# Patient Record
Sex: Male | Born: 1995 | Race: White | Hispanic: No | Marital: Single | Smoking: Never smoker
Health system: Southern US, Community
[De-identification: ages and names within clinical notes are randomized; demographics above are authoritative.]

## PROBLEM LIST (undated history)

## (undated) DIAGNOSIS — J939 Pneumothorax, unspecified: Secondary | ICD-10-CM

## (undated) DIAGNOSIS — G40909 Epilepsy, unspecified, not intractable, without status epilepticus: Secondary | ICD-10-CM

## (undated) DIAGNOSIS — J302 Other seasonal allergic rhinitis: Secondary | ICD-10-CM

## (undated) DIAGNOSIS — J45909 Unspecified asthma, uncomplicated: Secondary | ICD-10-CM

## (undated) HISTORY — DX: Pneumothorax, unspecified: J93.9

## (undated) HISTORY — DX: Other seasonal allergic rhinitis: J30.2

---
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
## 2013-03-03 ENCOUNTER — Emergency Department (HOSPITAL_COMMUNITY): Payer: Medicaid Other

## 2013-03-03 ENCOUNTER — Emergency Department (HOSPITAL_COMMUNITY)
Admission: EM | Admit: 2013-03-03 | Discharge: 2013-03-03 | Disposition: A | Discharge status: Home / Self Care | Payer: Medicaid Other | Attending: Emergency Medicine | Admitting: Emergency Medicine

## 2013-03-03 ENCOUNTER — Encounter (HOSPITAL_COMMUNITY): Payer: Self-pay | Admitting: Emergency Medicine

## 2013-03-03 DIAGNOSIS — J3489 Other specified disorders of nose and nasal sinuses: Secondary | ICD-10-CM | POA: Insufficient documentation

## 2013-03-03 DIAGNOSIS — R509 Fever, unspecified: Secondary | ICD-10-CM | POA: Insufficient documentation

## 2013-03-03 DIAGNOSIS — IMO0002 Reserved for concepts with insufficient information to code with codable children: Secondary | ICD-10-CM | POA: Insufficient documentation

## 2013-03-03 DIAGNOSIS — J9383 Other pneumothorax: Secondary | ICD-10-CM | POA: Insufficient documentation

## 2013-03-03 DIAGNOSIS — J45909 Unspecified asthma, uncomplicated: Secondary | ICD-10-CM | POA: Insufficient documentation

## 2013-03-03 DIAGNOSIS — Z79899 Other long term (current) drug therapy: Secondary | ICD-10-CM | POA: Insufficient documentation

## 2013-03-03 DIAGNOSIS — G40909 Epilepsy, unspecified, not intractable, without status epilepticus: Secondary | ICD-10-CM | POA: Insufficient documentation

## 2013-03-03 HISTORY — DX: Epilepsy, unspecified, not intractable, without status epilepticus: G40.909

## 2013-03-03 HISTORY — DX: Unspecified asthma, uncomplicated: J45.909

## 2013-03-03 LAB — BASIC METABOLIC PANEL
CO2: 27 mEq/L (ref 19–32)
Calcium: 9.6 mg/dL (ref 8.4–10.5)
Chloride: 101 mEq/L (ref 96–112)
Sodium: 138 mEq/L (ref 135–145)

## 2013-03-03 LAB — CBC WITH DIFFERENTIAL/PLATELET
Basophils Absolute: 0 10*3/uL (ref 0.0–0.1)
Eosinophils Relative: 10 % — ABNORMAL HIGH (ref 0–5)
Lymphocytes Relative: 12 % (ref 12–46)
Neutro Abs: 4.4 10*3/uL (ref 1.7–7.7)
Platelets: 212 10*3/uL (ref 150–400)
RDW: 11.4 % — ABNORMAL LOW (ref 11.5–15.5)
WBC: 6.8 10*3/uL (ref 4.0–10.5)

## 2013-03-03 MED ORDER — SODIUM CHLORIDE 0.9 % IV SOLN
INTRAVENOUS | Status: DC
  Administered 2013-03-03: 11:00:00 via INTRAVENOUS

## 2013-03-03 MED ORDER — BENZONATATE 100 MG PO CAPS: 100.0000 mg | ORAL_CAPSULE | Freq: Three times a day (TID) | ORAL | Status: DC

## 2013-03-03 MED ORDER — MORPHINE SULFATE 4 MG/ML IJ SOLN
4.0000 mg | INTRAMUSCULAR | Status: DC | PRN
  Administered 2013-03-03: 4 mg via INTRAVENOUS
  Filled 2013-03-03: qty 1

## 2013-03-03 NOTE — ED Notes (Addendum)
Pt stated that he had cough with fever x2 days .When asked pt stated that he took Tylenol 650mg  po

## 2013-03-03 NOTE — ED Provider Notes (Signed)
History    CSN: [id number] Arrival date & time 03/03/13  2130 First MD Initiated Contact with Patient 03/03/13 [phone]      Chief Complaint  Patient presents with  . Cough  . Fever    Patient is a 17 y.o. male presenting with cough and fever. The history is provided by the patient.  Cough Cough characteristics:  Dry Severity:  Moderate Onset quality:  Gradual Duration:  2 days Timing:  Constant Chronicity:  New Smoker: no   Context: smoke exposure (cigarette)   Relieved by:  Nothing Worsened by:  Nothing tried Associated symptoms: fever, rhinorrhea and sinus congestion   Associated symptoms: no chills and no rash   Fever Associated symptoms: cough and rhinorrhea   Associated symptoms: no chills and no rash     Past Medical History  Diagnosis Date  . Asthma   . Epilepsy     History reviewed. No pertinent past surgical history.  History reviewed. No pertinent family history.  History  Substance Use Topics  . Smoking status: Never Smoker   . Smokeless tobacco: Not on file  . Alcohol Use: No      Review of Systems  Constitutional: Positive for fever. Negative for chills.  HENT: Positive for rhinorrhea.   Respiratory: Positive for cough.   Skin: Negative for rash.  All other systems reviewed and are negative.    Allergies  Review of patient's allergies indicates no known allergies.  Home Medications   Current Outpatient Rx  Name  Route  Sig  Dispense  Refill  . acetaminophen (TYLENOL) 500 MG tablet   Oral   Take 1,000 mg by mouth every 6 (six) hours as needed for pain.         Marland Kitchen albuterol (PROVENTIL HFA;VENTOLIN HFA) 108 (90 BASE) MCG/ACT inhaler   Inhalation   Inhale 2 puffs into the lungs every 6 (six) hours as needed for wheezing or shortness of breath.         . fluticasone (FLOVENT HFA) 110 MCG/ACT inhaler   Inhalation   Inhale 1 puff into the lungs 2 (two) times daily.         Marland Kitchen levETIRAcetam (KEPPRA) 500 MG tablet   Oral   Take 500  mg by mouth every 12 (twelve) hours.         Marland Kitchen loratadine (CLARITIN) 10 MG tablet   Oral   Take 10 mg by mouth daily.           BP 114/75  Pulse 105  Temp(Src) 99.2 F (37.3 C) (Oral)  Resp 20  Ht 5\' 9"  (1.753 m)  Wt 135 lb (61.236 kg)  BMI 19.93 kg/m2  SpO2 96%  Physical Exam  Nursing note and vitals reviewed. Constitutional: He appears well-developed and well-nourished. No distress.  HENT:  Head: Normocephalic and atraumatic.  Right Ear: External ear normal.  Left Ear: External ear normal.  Eyes: Conjunctivae are normal. Right eye exhibits no discharge. Left eye exhibits no discharge. No scleral icterus.  Neck: Neck supple. No tracheal deviation present.  Cardiovascular: Normal rate, regular rhythm and intact distal pulses.   Pulmonary/Chest: Effort normal. No stridor. No respiratory distress. He has no wheezes. He has no rales.  Questionable decreased breath sounds right side  Abdominal: Soft. Bowel sounds are normal. He exhibits no distension. There is no tenderness. There is no rebound and no guarding.  Musculoskeletal: He exhibits no edema and no tenderness.  Neurological: He is alert. He has normal strength. No sensory deficit.  Cranial nerve deficit:  no gross defecits noted. He exhibits normal muscle tone. He displays no seizure activity. Coordination normal.  Skin: Skin is warm and dry. No rash noted.  Psychiatric: He has a normal mood and affect.    ED Course  Procedures (including critical care time)  Labs Reviewed  CBC WITH DIFFERENTIAL  BASIC METABOLIC PANEL   Dg Chest 2 View  03/03/2013   *RADIOLOGY REPORT*  Clinical Data: Cough, congestion and fever.  CHEST - 2 VIEW  Comparison: 10/14/2011  Findings: There is a spontaneous right pneumothorax of approximately 20% volume.  No underlying pulmonary nodule or infiltrate is seen.  There is no evidence of shift of the mediastinal structures.  Heart size is normal.  No pleural effusion is seen.  The bony thorax  is unremarkable.  IMPRESSION: Spontaneous right pneumothorax of approximately 20% volume. Findings were communicated directly to Dr. Lynelle Doctor at [phone] hours.   Original Report Authenticated By: Irish Lack, M.D.     1. Spontaneous pneumothorax       MDM  Pt has a spontaneous pneumothorax.  He is not in any distress.  I Consulted with Dr Tyrone Sage.  He is in the OR right now.  I contacted the office and they will have another cardiothoracic surgeon come to see him in the ED.  Dr. Laneta Simmers called me in regards to Mr. Allende.  He recommended a serial chest x-ray. If the pneumothorax remained stable he recommended outpatient followup but no need for any acute intervention.  The patient was monitored in the emergency department.  The patient was supposed to Get his second x-ray at 1330 but the xray tech inadvertently took him earlier.  Pt then did get an additional xray at 1330.      Celene Kras, MD 03/03/13 [phone]

## 2013-03-03 NOTE — Progress Notes (Signed)
Consulted with Dr. Luciana Axe regarding need for precautions and none advised at this time.

## 2013-03-06 ENCOUNTER — Ambulatory Visit (INDEPENDENT_AMBULATORY_CARE_PROVIDER_SITE_OTHER): Payer: Medicaid Other | Admitting: Physician Assistant

## 2013-03-06 ENCOUNTER — Other Ambulatory Visit: Payer: Self-pay | Admitting: *Deleted

## 2013-03-06 ENCOUNTER — Ambulatory Visit
Admission: RE | Admit: 2013-03-06 | Discharge: 2013-03-06 | Disposition: A | Discharge status: Home / Self Care | Payer: Medicaid Other | Source: Ambulatory Visit | Attending: Surgery | Admitting: Surgery

## 2013-03-06 VITALS — BP 103/73 | HR 88 | Resp 20 | Ht 69.0 in | Wt 135.0 lb

## 2013-03-06 DIAGNOSIS — J948 Other specified pleural conditions: Secondary | ICD-10-CM

## 2013-03-06 DIAGNOSIS — J939 Pneumothorax, unspecified: Secondary | ICD-10-CM | POA: Insufficient documentation

## 2013-03-06 DIAGNOSIS — J9383 Other pneumothorax: Secondary | ICD-10-CM

## 2013-03-06 NOTE — Progress Notes (Signed)
Patient ID: Antonio Miles, male   DOB: [birth year], 17 y.o.   MRN: [id number]  S:  Antonio Miles is an 17 yo male who presented to the Emergency Department on 03/03/2013 with complaints of cough and shortness of breath.  CXR obtained showed evidence of a right sided Spontaneous Pneumothorax.  Serial CXR was performed and the pneumothorax did not change in size.  TCTS was consulted and the films were reviewed by Dr. Laneta Simmers who did not feel patient would require intervention with chest tube placement at that time.  The patient was later discharged home and instructed to follow up with our office.  Currently the patient states he is doing better.  He is still experiencing cold like symptoms of runny nose and cough.  He denies chest/shoulder pain and shortness of breath.  Patient states he does not smoke and has never smoked, however ED chart states tobacco use.  O: BP 103/73  Pulse 88  Resp 20  Ht 5\' 9"  (1.753 m)  Wt 135 lb (61.236 kg)  BMI 19.93 kg/m2  SpO2 97%  Gen: no apparent distress Lungs: Coarse bilaterally, clears with cough, decreased air movement RUL Heart: RRR Abd: soft non-tender non-distended Neuro: grossly intact  CXR: improvement of right sided pneumothorax, no evidence of tension  A/P:  1. Right sided Pneumothorax- improved from previous film 2. Patient counseled on importance of tobacco cessation 3. RTC in 2 weeks with a CXR prior to his appointment.  Should he develop worsening shortness of breath, shoulder or chest pain he was instructed to report to the Emergency Department.

## 2013-03-15 ENCOUNTER — Ambulatory Visit: Payer: Medicaid Other | Admitting: Surgery

## 2013-03-23 ENCOUNTER — Other Ambulatory Visit: Payer: Self-pay | Admitting: *Deleted

## 2013-03-23 DIAGNOSIS — J9383 Other pneumothorax: Secondary | ICD-10-CM

## 2013-03-29 ENCOUNTER — Ambulatory Visit: Payer: Medicaid Other | Admitting: Surgery

## 2013-03-29 ENCOUNTER — Ambulatory Visit
Admission: RE | Admit: 2013-03-29 | Discharge: 2013-03-29 | Disposition: A | Discharge status: Home / Self Care | Payer: Medicaid Other | Source: Ambulatory Visit | Attending: Surgery | Admitting: Surgery

## 2013-03-29 ENCOUNTER — Ambulatory Visit (INDEPENDENT_AMBULATORY_CARE_PROVIDER_SITE_OTHER): Payer: Medicaid Other | Admitting: Surgery

## 2013-03-29 ENCOUNTER — Encounter: Payer: Self-pay | Admitting: Surgery

## 2013-03-29 VITALS — BP 115/77 | HR 104 | Resp 17 | Ht 72.0 in | Wt 130.0 lb

## 2013-03-29 DIAGNOSIS — J9383 Other pneumothorax: Secondary | ICD-10-CM

## 2013-03-29 DIAGNOSIS — J939 Pneumothorax, unspecified: Secondary | ICD-10-CM

## 2013-03-29 NOTE — Progress Notes (Signed)
      [address]       [place] [address]             [phone]        HPI:    Mr. Ullom is an 17 yo male who presented to the Emergency Department on 03/03/2013 with complaints of cough and shortness of breath.  CXR obtained showed evidence of a right sided Spontaneous Pneumothorax.  Serial CXR was performed and the pneumothorax did not change in size.  TCTS was consulted and the films were reviewed by me and I did not feel patient would require intervention with chest tube placement at that time.  The patient was later discharged home and instructed to follow up with our office. He saw one of the PA's 2 weeks ago and the pneumothorax was resolving on cxr.  He denies chest/shoulder pain and shortness of breath.  Patient states he does not smoke and has never smoked.  Current Outpatient Prescriptions  Medication Sig Dispense Refill  . acetaminophen (TYLENOL) 500 MG tablet Take 1,000 mg by mouth every 6 (six) hours as needed for pain.      Marland Kitchen albuterol (PROVENTIL HFA;VENTOLIN HFA) 108 (90 BASE) MCG/ACT inhaler Inhale 2 puffs into the lungs every 6 (six) hours as needed for wheezing or shortness of breath.      . benzonatate (TESSALON) 100 MG capsule Take 1 capsule (100 mg total) by mouth every 8 (eight) hours.  21 capsule  0  . fluticasone (FLOVENT HFA) 110 MCG/ACT inhaler Inhale 1 puff into the lungs 2 (two) times daily.      Marland Kitchen levETIRAcetam (KEPPRA) 500 MG tablet Take 500 mg by mouth every 12 (twelve) hours.      Marland Kitchen loratadine (CLARITIN) 10 MG tablet Take 10 mg by mouth daily.       No current facility-administered medications for this visit.     Physical Exam: BP 115/77  Pulse 104  Resp 17  Ht 6' (1.829 m)  Wt 130 lb (58.968 kg)  BMI 17.63 kg/m2  SpO2 98% He looks well. Lung exam is clear.   Diagnostic Tests:  *RADIOLOGY REPORT*   Clinical Data: Follow up pneumothorax.  No current complaints.   CHEST - 2 VIEW   Comparison: 03/06/2013   Findings: The  right side pneumothorax has resolved.  There is no residual pneumothorax.   The heart, mediastinum and hila are within normal limits.  The lungs are clear.  No pleural effusion.  Normal bony thorax and surrounding soft tissues.   IMPRESSION: Resolved right pneumothorax.     Original Report Authenticated By: Amie Portland, M.D.     Impression:  The spontaneous right pneumothorax has completely resolved. I explained to the patient that the chance of this occurring again is about 20% of the next few years.  Plan:  He will contact our office or go to the ER if he develops any recurrent symptoms.

## 2014-10-24 IMAGING — CR DG CHEST 2V
2 series · 2 of 2 positions shown · non-contrast
Comparison: Chest x-ray of 03/03/2013

CLINICAL DATA: History of right hydropneumothorax, cough

CHEST - 2 VIEW

[w chest pa]
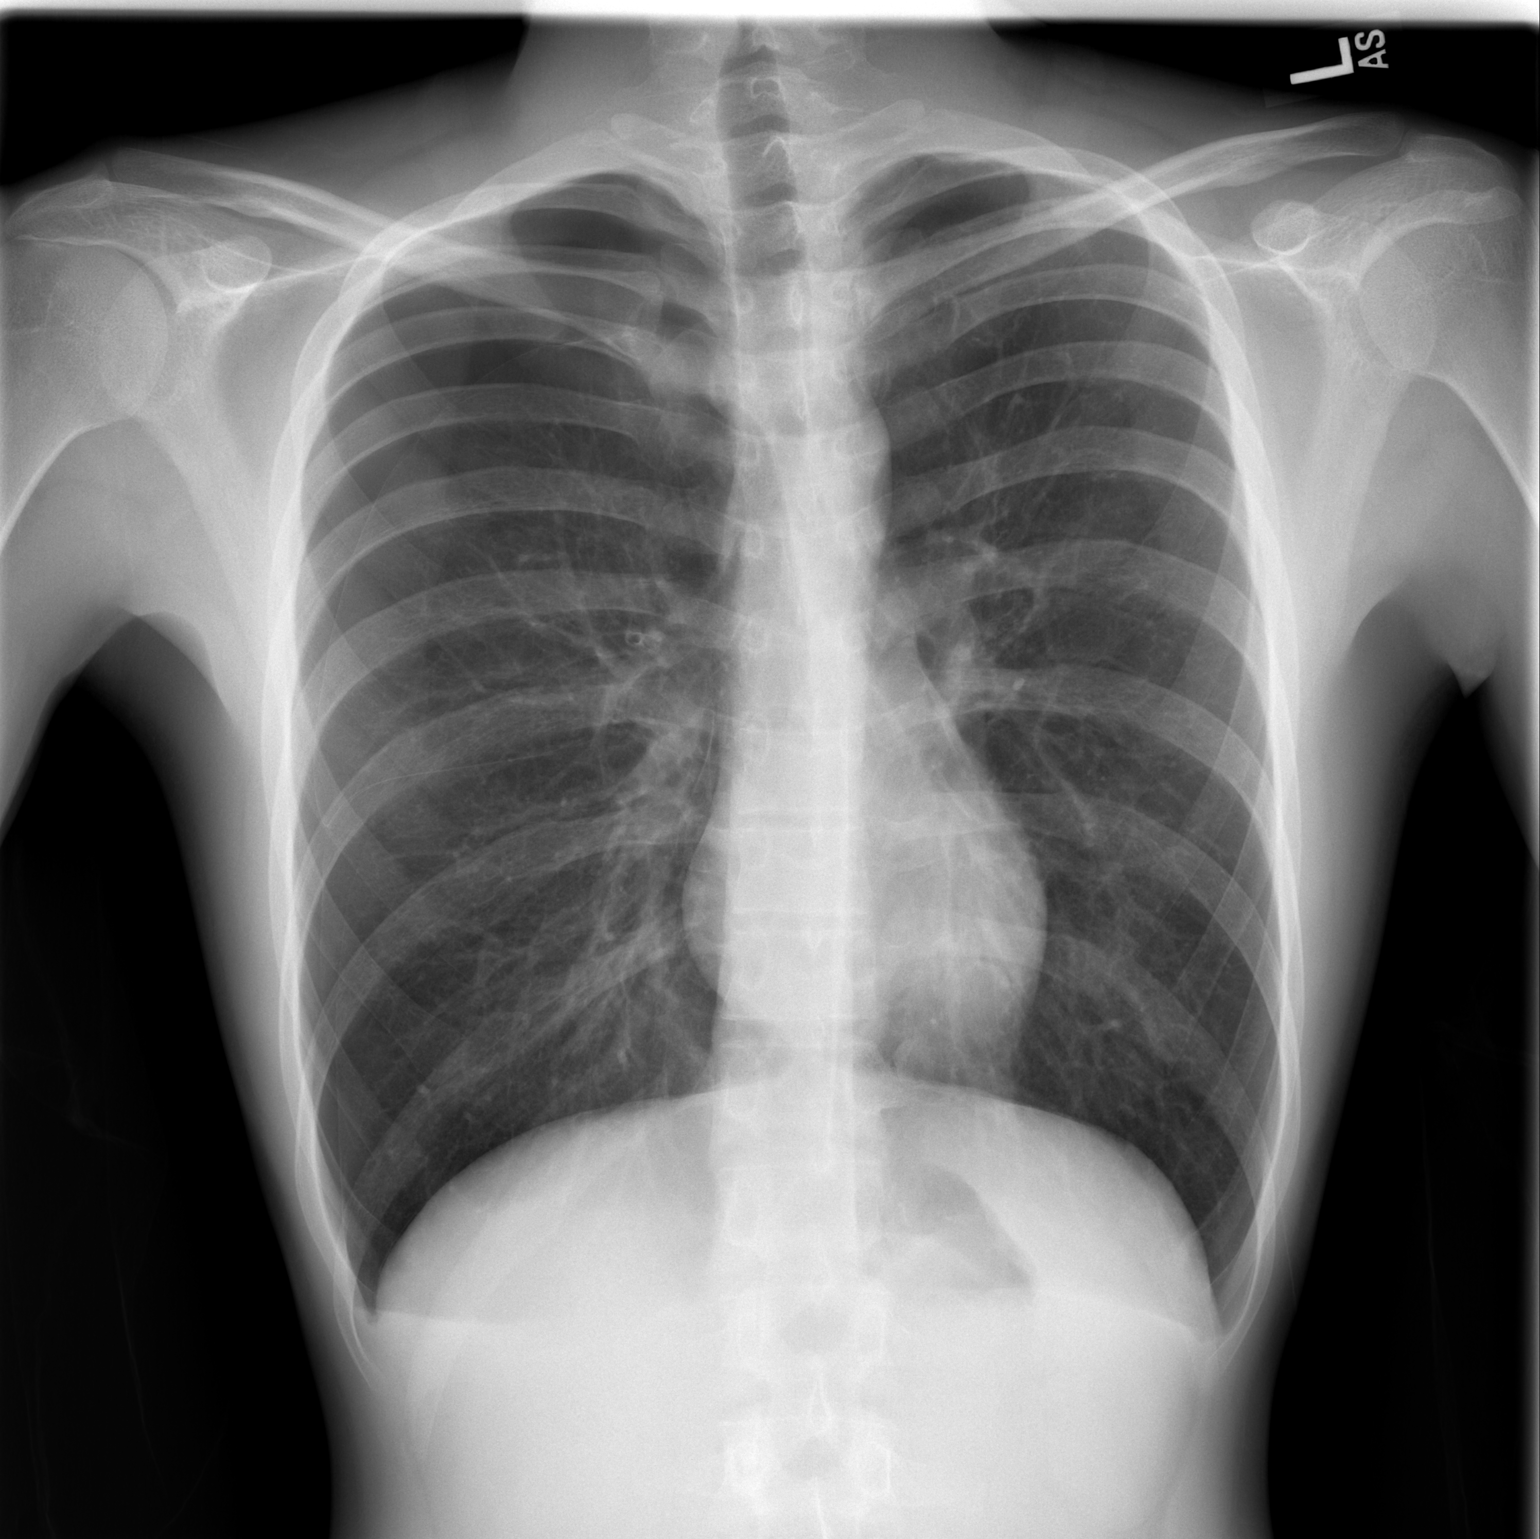

[w chest lat]
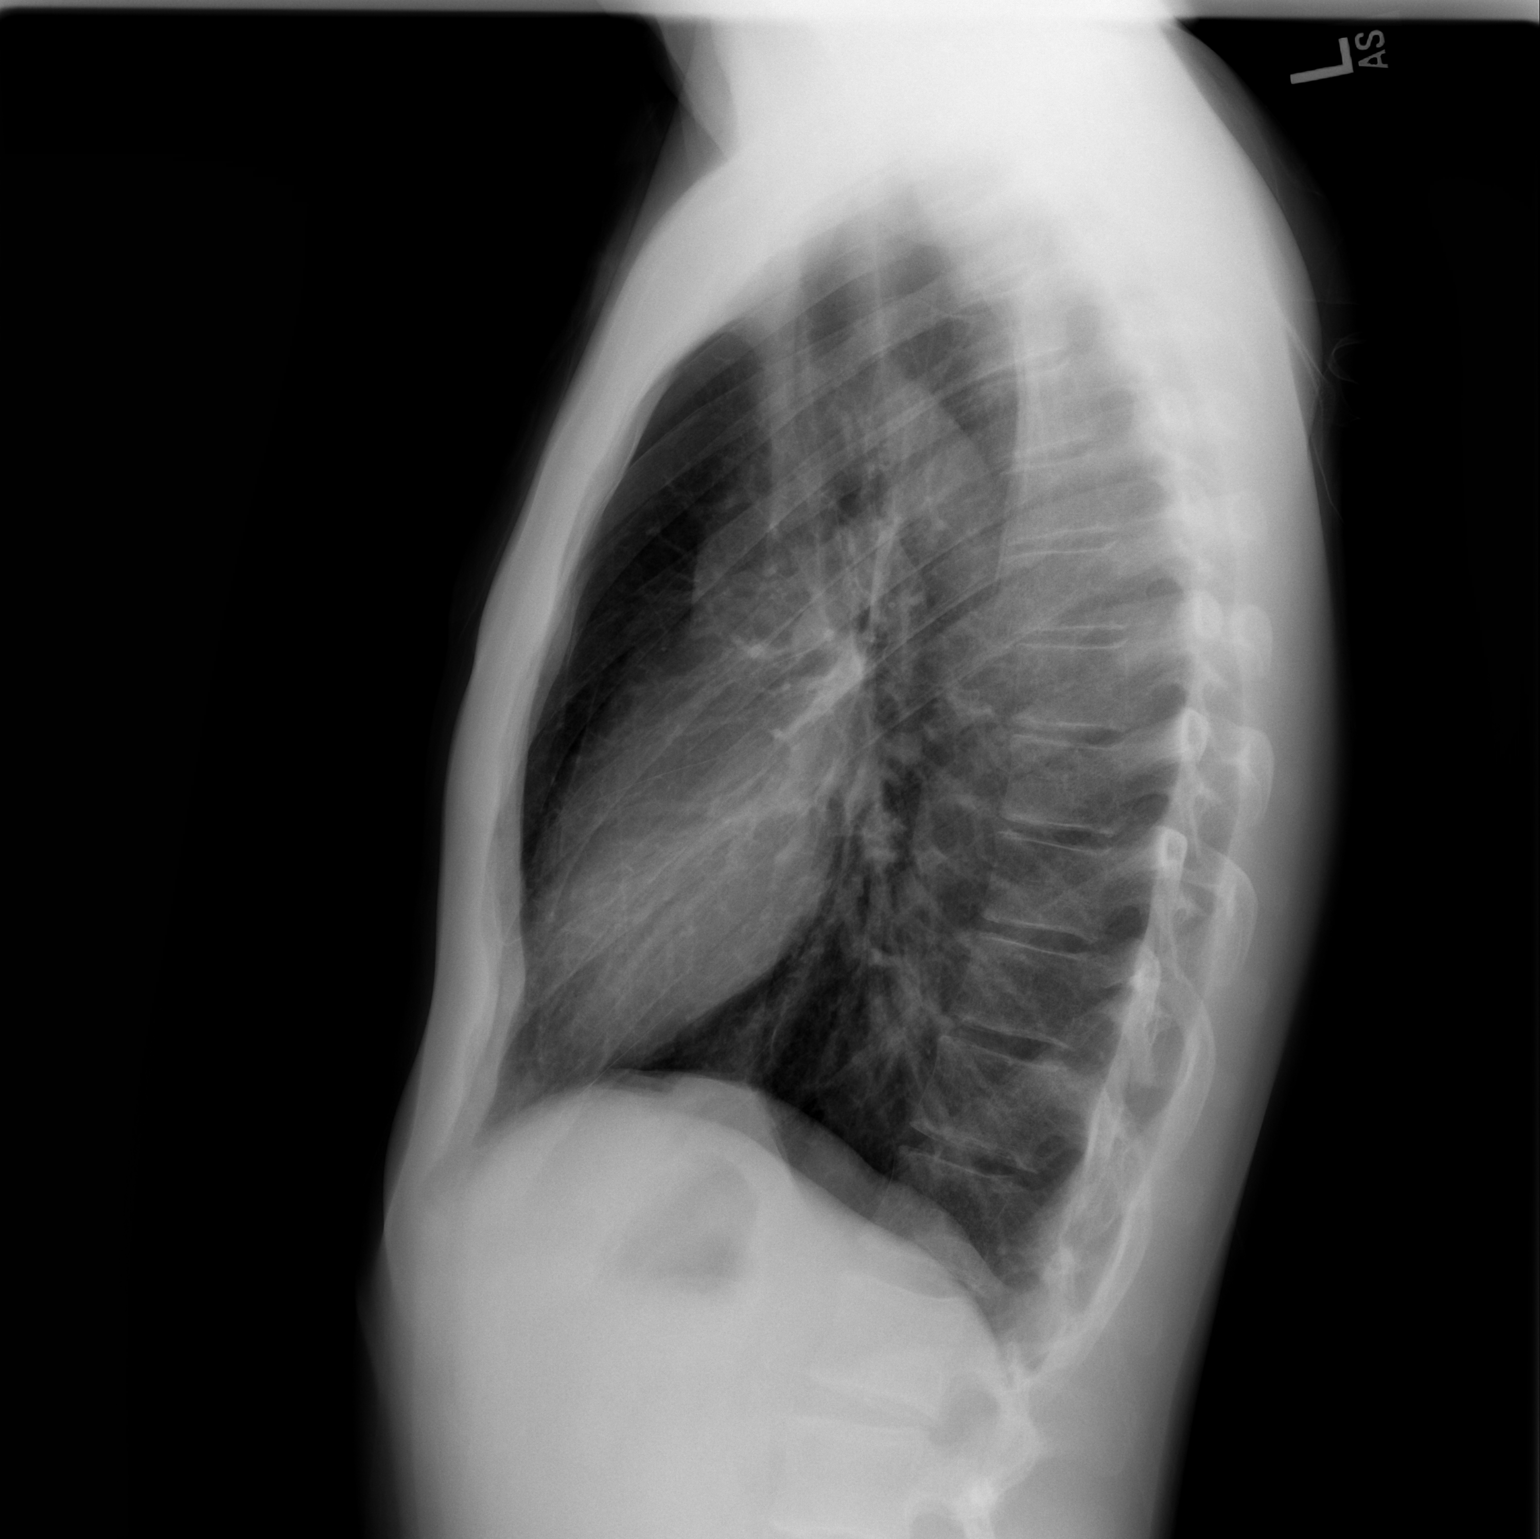

[2 of 2 positions shown; findings below may reference images not displayed]

FINDINGS: The right hydropneumothorax may have diminished slightly
in overall volume.  The left lung is clear.  Mediastinal contours
are stable, and heart size is stable.  No bony abnormality is seen.
IMPRESSION: Little change to perhaps slight decrease in volume of the right
hydropneumothorax.

## 2017-10-07 ENCOUNTER — Encounter: Payer: Self-pay | Admitting: Neurology

## 2017-12-17 ENCOUNTER — Other Ambulatory Visit: Payer: PRIVATE HEALTH INSURANCE

## 2017-12-17 ENCOUNTER — Other Ambulatory Visit: Payer: Self-pay

## 2017-12-17 ENCOUNTER — Ambulatory Visit: Payer: PRIVATE HEALTH INSURANCE | Admitting: Neurology

## 2017-12-17 ENCOUNTER — Encounter: Payer: Self-pay | Admitting: Neurology

## 2017-12-17 VITALS — BP 126/84 | HR 100 | Wt 140.0 lb

## 2017-12-17 DIAGNOSIS — G40209 Localization-related (focal) (partial) symptomatic epilepsy and epileptic syndromes with complex partial seizures, not intractable, without status epilepticus: Secondary | ICD-10-CM | POA: Insufficient documentation

## 2017-12-17 DIAGNOSIS — Q851 Tuberous sclerosis: Secondary | ICD-10-CM

## 2017-12-17 MED ORDER — PHENYTOIN SODIUM EXTENDED 100 MG PO CAPS: ORAL_CAPSULE | ORAL | 3 refills | Status: AC

## 2017-12-17 NOTE — Patient Instructions (Addendum)
1. Schedule 1-hour sleep-deprived EEG 2. Bloodwork for Dilantin level  Your provider has requested that you have labwork completed today. Please go to [facility] ([id number]) on the second floor of this building before leaving the office today. You do not need to check in. If you are not called within 15 minutes please check with the front desk.   3. Continue Dilantin 300mg  at bedtime for now. We may increase dose depending on your blood level 4. Keep a calendar of your seizures 5. Records from [facility] will be requested for review 6. Follow-up in 4-5 months, call for any changes  Seizure Precautions: 1. If medication has been prescribed for you to prevent seizures, take it exactly as directed.  Do not stop taking the medicine without talking to your doctor first, even if you have not had a seizure in a long time.   2. Avoid activities in which a seizure would cause danger to yourself or to others.  Don't operate dangerous machinery, swim alone, or climb in high or dangerous places, such as on ladders, roofs, or girders.  Do not drive unless your doctor says you may.  3. If you have any warning that you may have a seizure, lay down in a safe place where you can't hurt yourself.    4.  No driving for 6 months from last seizure, as per [facility].   Please refer to the following link on the [facility] website for more information: http://www.epilepsyfoundation.org/[email or web address]/driving/drivingu.cfm   5.  Maintain good sleep hygiene. Avoid alcohol.  6.  Contact your doctor if you have any problems that may be related to the medicine you are taking.  7.  Call 911 and bring the patient back to the ED if:        A.  The seizure lasts longer than 5 minutes.       B.  The patient doesn't awaken shortly after the seizure  C.  The patient has new problems such as difficulty seeing, speaking or moving  D.  The patient was injured during the seizure  E.   The patient has a temperature over 102 F (39C)  F.  The patient vomited and now is having trouble breathing

## 2017-12-17 NOTE — Progress Notes (Signed)
NEUROLOGY CONSULTATION NOTE  Antonio Miles MRN: [id number] DOB: [birth year]  Referring provider: Dr. Suzanna Obey Primary care provider: Dr. Suzanna Obey  Reason for consult:  Establish care for epilepsy  Dear Dr Doreene Nest:  Thank you for your kind referral of Antonio Miles for consultation of the above symptoms. Although his history is well known to you, please allow me to reiterate it for the purpose of our medical record. The patient was accompanied to the clinic by his mother who also provides collateral information. Records and images were personally reviewed where available.  HISTORY OF PRESENT ILLNESS: This is a 22 year old right-handed man with a history of tuberous sclerosis and seizures presenting to establish care. His mother reports that he started having seizures at age 1, at which time the tuberous sclerosis was also diagnosed. He reports having partial seizures and grand mal seizures. He has no warning prior to the GTCs, no focal weakness after. The last GTC was in April 2017, his mother heard him fall and found him convulsing. She reports he used to have nocturnal convulsions when younger, none since moving here. The partial seizures occur every few months, he would have an inability to speak and think of words, having a hard time remembering what he was thinking. Occasionally he feels nauseated. His mother witnessed one a few months ago where he would have a blank look ("like he couldn't see me") and stop talking. He reports the last partial seizure was a month ago, but he sometimes feels like it might come on but does not progress around once a week. He denies any olfactory/gustatory hallucinations, focal numbness/tingling/weakness, myoclonic jerks. He and his mother recall he took Depakote in the past which caused weight gain, then Keppra. He was briefly on Vimpat but had more "partial seizure problems." He has been on monotherapy with Dilantin 300mg  qhs for the past few years with no  side effects. He was being followed at [facility] in [facility] for several years until he moved to [facility] in 2017 to help his mother. His mother reports that he occasionally has trouble putting his words together, trying to remember what a word is.   He denies any frequent headaches, dizziness, diplopia, dysarthria/dysphagia, neck/back pain, bowel/bladder dysfunction. Memory is good. He completed 2 years of college. He is unemployed. He does not drive. He lives with his mother.   Epilepsy Risk Factors:  Tuberous sclerosis. His paternal half-sister has seizures. Otherwise he had a normal birth and early development.  There is no history of febrile convulsions, CNS infections such as meningitis/encephalitis, significant traumatic brain injury, neurosurgical procedures.  Diagnostic Data: I personally reviewed MRI brain done 10/2011 which showed multiple foci of increased signal consistent with cortical tubers, most noticeable in the left parietal cortical and subcortical region.  No prior EEG available for review  PAST MEDICAL HISTORY: Past Medical History:  Diagnosis Date  . Asthma   . Epilepsy ([place])   . Pneumothorax   . Seasonal allergies     PAST SURGICAL HISTORY: History reviewed. No pertinent surgical history.  MEDICATIONS: Current Outpatient Medications on File Prior to Visit  Medication Sig Dispense Refill  . albuterol (PROVENTIL HFA;VENTOLIN HFA) 108 (90 BASE) MCG/ACT inhaler Inhale 2 puffs into the lungs every 6 (six) hours as needed for wheezing or shortness of breath.    . phenytoin (DILANTIN) 100 MG ER capsule Take by mouth.     No current facility-administered medications on file prior to visit.     ALLERGIES: No  Known Allergies  FAMILY HISTORY: Family History  Problem Relation Age of Onset  . Seizures Sister     SOCIAL HISTORY: Social History   Socioeconomic History  . Marital status: Single    Spouse name: Not on file  . Number of children: Not on file  . Years of  education: Not on file  . Highest education level: Not on file  Social Needs  . Financial resource strain: Not on file  . Food insecurity - worry: Not on file  . Food insecurity - inability: Not on file  . Transportation needs - medical: Not on file  . Transportation needs - non-medical: Not on file  Occupational History  . Not on file  Tobacco Use  . Smoking status: Never Smoker  . Smokeless tobacco: Never Used  Substance and Sexual Activity  . Alcohol use: No  . Drug use: No  . Sexual activity: Not Currently  Other Topics Concern  . Not on file  Social History Narrative  . Not on file    REVIEW OF SYSTEMS: Constitutional: No fevers, chills, or sweats, no generalized fatigue, change in appetite Eyes: No visual changes, double vision, eye pain Ear, nose and throat: No hearing loss, ear pain, nasal congestion, sore throat Cardiovascular: No chest pain, palpitations Respiratory:  No shortness of breath at rest or with exertion, wheezes GastrointestinaI: No nausea, vomiting, diarrhea, abdominal pain, fecal incontinence Genitourinary:  No dysuria, urinary retention or frequency Musculoskeletal:  No neck pain, back pain Integumentary: No rash, pruritus, skin lesions Neurological: as above Psychiatric: No depression, insomnia, anxiety Endocrine: No palpitations, fatigue, diaphoresis, mood swings, change in appetite, change in weight, increased thirst Hematologic/Lymphatic:  No anemia, purpura, petechiae. Allergic/Immunologic: no itchy/runny eyes, nasal congestion, recent allergic reactions, rashes  PHYSICAL EXAM: Vitals:   12/17/17 1410  BP: 126/84  Pulse: 100  SpO2: 96%   General: No acute distress Head:  Normocephalic/atraumatic, he has multiple facial angiofibromas Eyes: Fundoscopic exam shows bilateral sharp discs, no vessel changes, exudates, or hemorrhages Neck: supple, no paraspinal tenderness, full range of motion Back: No paraspinal tenderness Heart: regular rate  and rhythm Lungs: Clear to auscultation bilaterally. Vascular: No carotid bruits. Skin/Extremities: No rash, no edema. No ash leaf spot or shagreen patch noted Neurological Exam: Mental status: alert and oriented to person, place, and time, no dysarthria or aphasia, Fund of knowledge is appropriate.  Recent and remote memory are intact. 3/3 delayed recall. Attention and concentration are normal.    Able to name objects and repeat phrases. Cranial nerves: CN I: not tested CN II: pupils equal, round and reactive to light, visual fields intact, fundi unremarkable. CN III, IV, VI:  full range of motion, no nystagmus, no ptosis CN V: facial sensation intact CN VII: upper and lower face symmetric CN VIII: hearing intact to finger rub CN IX, X: gag intact, uvula midline CN XI: sternocleidomastoid and trapezius muscles intact CN XII: tongue midline Bulk & Tone: normal, no fasciculations. Motor: 5/5 throughout with no pronator drift. Sensation: intact to light touch, cold, pin, vibration and joint position sense.  No extinction to double simultaneous stimulation.  Romberg test negative Deep Tendon Reflexes: +2 throughout, no ankle clonus Plantar responses: downgoing bilaterally Cerebellar: no incoordination on finger to nose, heel to shin. No dysdiadochokinesia Gait: narrow-based and steady, able to tandem walk adequately. 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 Prior MRI brain showed multiple foci of increased signal consistent with cortical tubers, most noticeable in the left parietal cortical and subcortical region. He has not had any convulsions since April 2017, but continues to report partial seizures affecting his speech. His mother has reported this is associated with a blank look. A 1-hour EEG will be ordered  to further classify his seizures. Records from [facility] will be requested for review. He is on Dilantin 300mg  qhs with no side effects, check Dilantin level. He is interested in increasing dose, depending on level. He may benefit from a newer AED with less long-term side effects. He does not drive. He was advised to keep a seizure calendar. He will follow-up in 4-5 months and knows to call for any changes.   Thank you for allowing me to participate in the care of this patient. Please do not hesitate to call for any questions or concerns.   Ellouise Newer, M.D.  CC: Dr. Doreene Nest

## 2017-12-18 LAB — PHENYTOIN LEVEL, TOTAL: Phenytoin, Total: 6.3 mg/L — ABNORMAL LOW (ref 10.0–20.0)

## 2017-12-20 ENCOUNTER — Telehealth: Payer: Self-pay

## 2017-12-20 ENCOUNTER — Other Ambulatory Visit: Payer: Self-pay

## 2017-12-20 MED ORDER — PHENYTOIN 50 MG PO CHEW: 50.0000 mg | CHEWABLE_TABLET | Freq: Every day | ORAL | 11 refills | Status: DC

## 2017-12-20 NOTE — Telephone Encounter (Signed)
-----   Message from Cameron Sprang, MD sent at 12/20/2017  9:38 AM EDT ----- Pls let him know that the Dilantin level is low. Pls make sure he has not missed any doses before we did the blood test. If no missed doses, we will increase Dilantin to 350mg  once a day. He is taking 300mg  qhs, pls send in an additional Rx for Dilantin 50mg  take 1 tab qhs with 300mg , for total for 350mg  qhs. Thanks

## 2017-12-20 NOTE — Telephone Encounter (Signed)
LMOM relaying message below.  

## 2017-12-22 ENCOUNTER — Other Ambulatory Visit: Payer: PRIVATE HEALTH INSURANCE

## 2017-12-29 ENCOUNTER — Ambulatory Visit (INDEPENDENT_AMBULATORY_CARE_PROVIDER_SITE_OTHER): Payer: PRIVATE HEALTH INSURANCE | Admitting: Neurology

## 2017-12-29 DIAGNOSIS — G40209 Localization-related (focal) (partial) symptomatic epilepsy and epileptic syndromes with complex partial seizures, not intractable, without status epilepticus: Secondary | ICD-10-CM

## 2017-12-29 DIAGNOSIS — Q851 Tuberous sclerosis: Secondary | ICD-10-CM

## 2018-01-12 NOTE — Procedures (Signed)
ELECTROENCEPHALOGRAM REPORT  Date of Study: 12/29/2017  Patient's Name: Antonio Miles MRN: [id number] Date of Birth: [birth year]  Referring Provider: Dr. Ellouise Newer  Clinical History: This is a 23 year old man with tuberous sclerosis and seizures with blank look and speech difficulties. EEG for classification.  Medications: Dilantin Proventil  Technical Summary: A multichannel digital 1-hour sleep-deprived EEG recording measured by the international 10-20 system with electrodes applied with paste and impedances below 5000 ohms performed in our laboratory with EKG monitoring in an awake and asleep patient.  Hyperventilation and photic stimulation were performed.  The digital EEG was referentially recorded, reformatted, and digitally filtered in a variety of bipolar and referential montages for optimal display.    Description: The patient is awake and asleep during the recording.  During maximal wakefulness, there is a symmetric, medium voltage 11 Hz posterior dominant rhythm that attenuates with eye opening.  There is occasional focal 4-5 Hz theta slowing over the left frontocentral region.  During drowsiness and sleep, there is an increase in theta slowing of the background.  Vertex waves and symmetric sleep spindles were seen.  Hyperventilation and photic stimulation did not elicit any abnormalities.  There were frequent low to medium voltage left frontotemporal epileptiform discharges seen more in sleep. There were no electrographic seizures seen.    EKG lead was unremarkable.  Impression: This 1-hour awake and asleep EEG is abnormal due to the presence of: 1. Occasional focal slowing over the left frontocentral region 2. Frequent left frontotemporal epileptiform discharges  Clinical Correlation of the above findings indicates focal cerebral dysfunction over the left frontocentral region suggestive of underlying structural or physiologic abnormality. There is a possible tendency for  seizures to arise from the left frontotemporal region. Clinical correlation is advised.   Ellouise Newer, M.D.

## 2018-01-13 ENCOUNTER — Telehealth: Payer: Self-pay | Admitting: Neurology

## 2018-01-13 MED ORDER — PHENYTOIN 50 MG PO CHEW: CHEWABLE_TABLET | ORAL | 11 refills | Status: AC

## 2018-01-13 NOTE — Telephone Encounter (Signed)
Discussed EEG findings with patient. He has not started the additional 50mg  dilantin yet. Sent to a different pharmacy. He will increase Dilantin as planned to 350mg  qhs and will keep a calendar of seizures.

## 2018-03-24 ENCOUNTER — Telehealth: Payer: Self-pay | Admitting: Neurology

## 2018-03-25 NOTE — Telephone Encounter (Signed)
Called pt.  No answer.  LMOM relaying message below.  Asked for return call to the office

## 2018-03-25 NOTE — Telephone Encounter (Signed)
Let's check his Dilantin level, we had increased the dose, see if level in system is too high, but this would likely not cause his symptoms. If Dilantin level is fine, we can talk about starting an antidepressant. Thanks

## 2018-04-08 ENCOUNTER — Telehealth: Payer: Self-pay | Admitting: Neurology

## 2018-04-08 NOTE — Telephone Encounter (Signed)
Patient left message on VM he would like to talk to someone about getting his drivers license please call

## 2018-04-12 NOTE — Telephone Encounter (Signed)
Spoke with pt.  He states that he feels that he may be experiencing more partial seizures.  He also states that he is under a lot of stress right now so he is contributing this to that.    I let pt know that Dr. Delice Lesch does not determine weather a pt can drive or not.  We can fill out the [facility] paperwork and fax it to the [facility] but that their medical review team is who makes the final determination.  Pt was OK with this.  I let him know that I will need his signature on the DMV forms before I can send them.  Pt states that he will come by the office sometime soon to sign.

## 2018-04-12 NOTE — Telephone Encounter (Signed)
Patient states he has noticed his symptoms become worse and wants to discuss maybe increasing or changing his medication. Patient would also like to discuss getting his license despite him being epileptic.

## 2018-04-20 ENCOUNTER — Telehealth: Payer: Self-pay | Admitting: Neurology

## 2018-04-20 NOTE — Telephone Encounter (Signed)
Jef called to cancel appointment, he is moving out of state and will have new doctor send record request.

## 2018-05-04 ENCOUNTER — Ambulatory Visit: Payer: PRIVATE HEALTH INSURANCE | Admitting: Neurology
